# Patient Record
Sex: Female | Born: 1960 | Race: White | Hispanic: No | Smoking: Current every day smoker
Health system: Southern US, Community
[De-identification: ages and names within clinical notes are randomized; demographics above are authoritative.]

## PROBLEM LIST (undated history)

## (undated) DIAGNOSIS — K8020 Calculus of gallbladder without cholecystitis without obstruction: Secondary | ICD-10-CM

## (undated) DIAGNOSIS — G9050 Complex regional pain syndrome I, unspecified: Secondary | ICD-10-CM

## (undated) DIAGNOSIS — E079 Disorder of thyroid, unspecified: Secondary | ICD-10-CM

## (undated) DIAGNOSIS — M810 Age-related osteoporosis without current pathological fracture: Secondary | ICD-10-CM

## (undated) HISTORY — PX: ABDOMINAL HYSTERECTOMY: SHX81

## (undated) HISTORY — DX: Disorder of thyroid, unspecified: E07.9

## (undated) HISTORY — DX: Complex regional pain syndrome I, unspecified: G90.50

## (undated) HISTORY — DX: Age-related osteoporosis without current pathological fracture: M81.0

## (undated) HISTORY — DX: Calculus of gallbladder without cholecystitis without obstruction: K80.20

---

## 2007-09-14 ENCOUNTER — Emergency Department: Payer: Self-pay | Admitting: Emergency Medicine

## 2008-03-05 HISTORY — PX: FRACTURE SURGERY: SHX138

## 2010-06-27 ENCOUNTER — Ambulatory Visit: Payer: Self-pay | Admitting: Oncology

## 2010-06-29 LAB — CEA: CEA: 2.4 ng/mL (ref 0.0–4.7)

## 2010-06-29 LAB — CA 125: CA 125: 14.7 U/mL

## 2010-06-29 LAB — CANCER ANTIGEN 19-9: CA 19-9: 9 U/mL (ref 0–35)

## 2010-06-29 LAB — CANCER ANTIGEN 27.29: CA 27.29: 19.7 U/mL

## 2010-07-04 ENCOUNTER — Ambulatory Visit: Payer: Self-pay | Admitting: Oncology

## 2010-08-04 ENCOUNTER — Ambulatory Visit: Payer: Self-pay | Admitting: Oncology

## 2011-05-21 ENCOUNTER — Emergency Department: Payer: Self-pay | Admitting: Internal Medicine

## 2011-05-21 LAB — COMPREHENSIVE METABOLIC PANEL
Albumin: 3.5 g/dL (ref 3.4–5.0)
Anion Gap: 12 (ref 7–16)
Calcium, Total: 8.7 mg/dL (ref 8.5–10.1)
Glucose: 109 mg/dL — ABNORMAL HIGH (ref 65–99)
Osmolality: 277 (ref 275–301)
Potassium: 3.3 mmol/L — ABNORMAL LOW (ref 3.5–5.1)
Sodium: 140 mmol/L (ref 136–145)

## 2011-05-21 LAB — CBC
Platelet: 256 10*3/uL (ref 150–440)
RDW: 13.8 % (ref 11.5–14.5)
WBC: 7.9 10*3/uL (ref 3.6–11.0)

## 2011-05-21 LAB — TSH: Thyroid Stimulating Horm: 0.58 u[IU]/mL

## 2011-05-21 LAB — TROPONIN I: Troponin-I: <0.02 ng/mL

## 2012-04-03 IMAGING — CR DG CHEST 1V PORT
1 series · 1 of 1 positions shown · non-contrast
Comparison: none

REASON FOR EXAM: sob
COMMENTS:

PROCEDURE:     DXR - DXR PORTABLE CHEST SINGLE VIEW  - May 21, 2011 [DATE]
RESULT:     The lungs are clear. The cardiac silhouette and visualized bony
skeleton are unremarkable.

[portable]
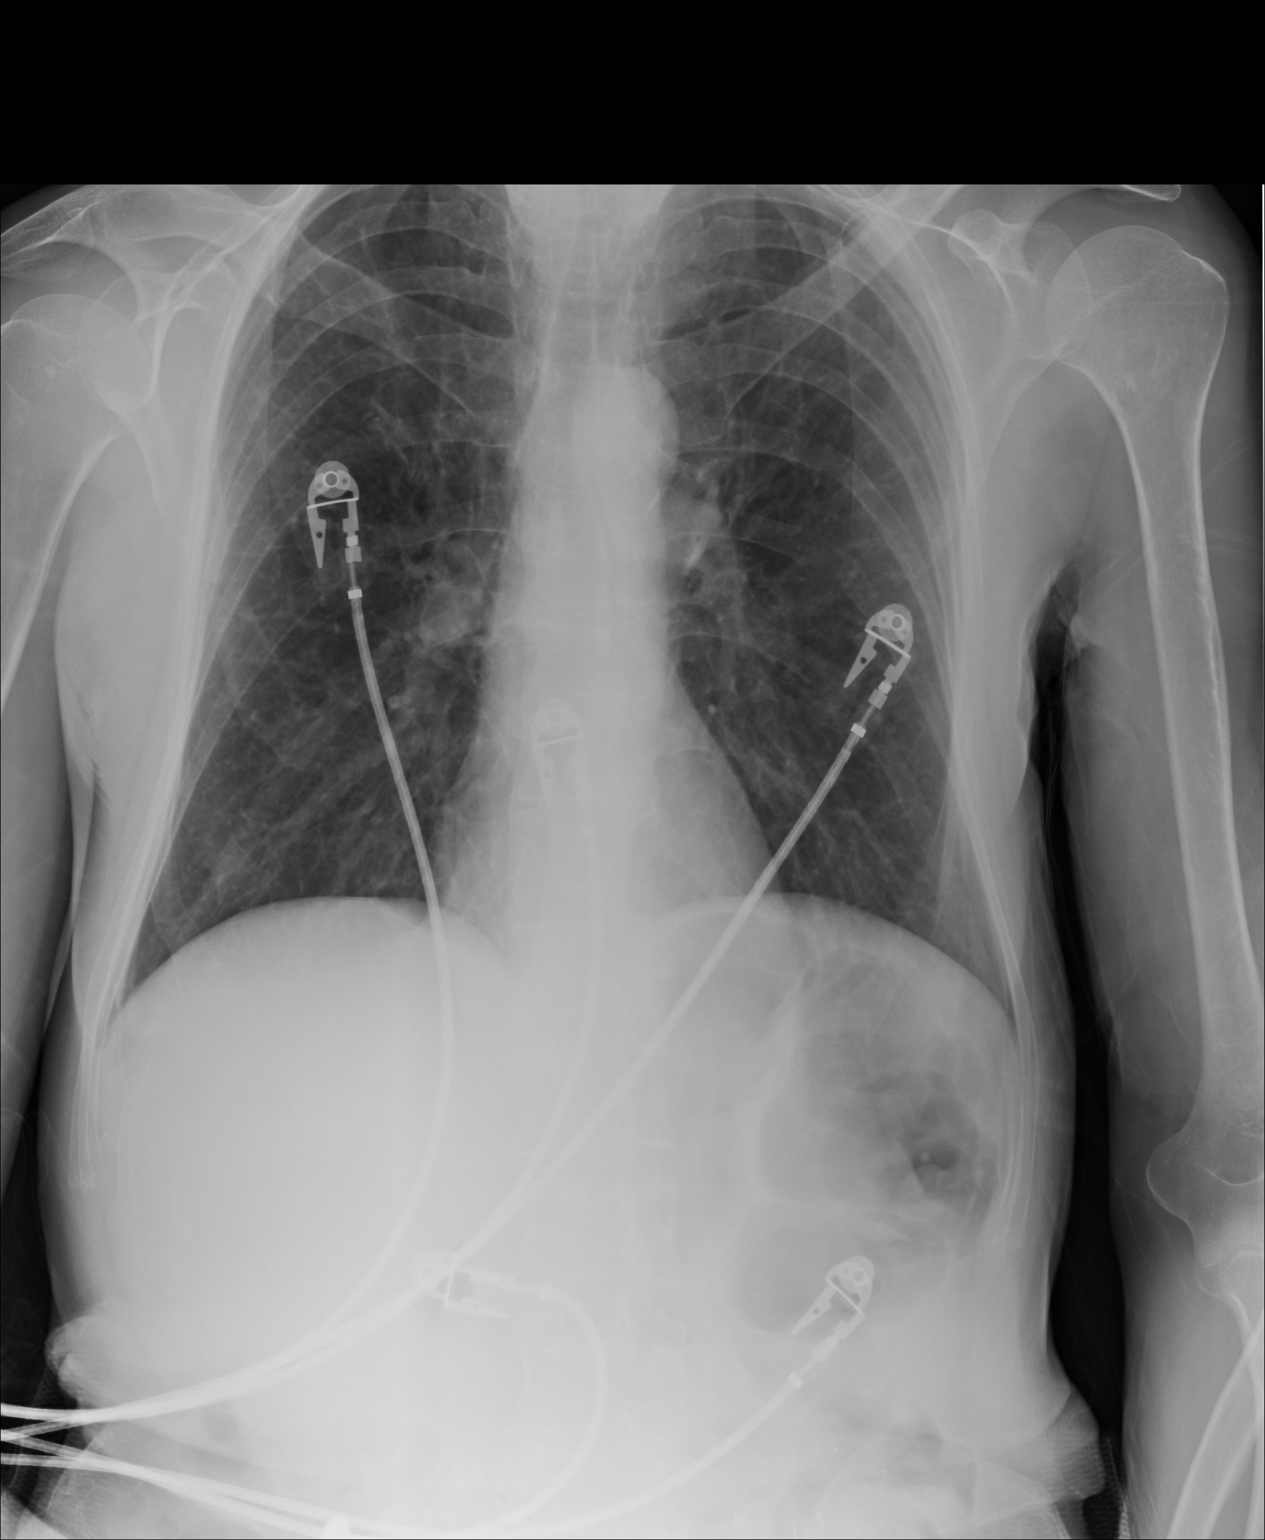

[1 of 1 positions shown; findings below may reference images not displayed]

IMPRESSION: 1. Chest radiograph without evidence of acute cardiopulmonary disease.

## 2017-03-20 ENCOUNTER — Ambulatory Visit: Payer: Medicare Other | Admitting: Nurse Practitioner

## 2017-03-26 ENCOUNTER — Other Ambulatory Visit: Payer: Self-pay

## 2017-03-26 ENCOUNTER — Ambulatory Visit: Payer: Medicare Other | Attending: Nurse Practitioner | Admitting: Nurse Practitioner

## 2017-03-26 ENCOUNTER — Encounter: Payer: Self-pay | Admitting: Nurse Practitioner

## 2017-03-26 VITALS — BP 122/77 | HR 86 | Temp 98.2°F | Resp 16 | Ht 65.0 in | Wt 95.0 lb

## 2017-03-26 DIAGNOSIS — F17210 Nicotine dependence, cigarettes, uncomplicated: Secondary | ICD-10-CM | POA: Insufficient documentation

## 2017-03-26 DIAGNOSIS — Z7409 Other reduced mobility: Secondary | ICD-10-CM | POA: Insufficient documentation

## 2017-03-26 DIAGNOSIS — M25571 Pain in right ankle and joints of right foot: Secondary | ICD-10-CM | POA: Insufficient documentation

## 2017-03-26 DIAGNOSIS — M79604 Pain in right leg: Secondary | ICD-10-CM | POA: Diagnosis not present

## 2017-03-26 DIAGNOSIS — G8929 Other chronic pain: Secondary | ICD-10-CM

## 2017-03-26 DIAGNOSIS — Z90710 Acquired absence of both cervix and uterus: Secondary | ICD-10-CM | POA: Diagnosis not present

## 2017-03-26 DIAGNOSIS — R7982 Elevated C-reactive protein (CRP): Secondary | ICD-10-CM | POA: Diagnosis not present

## 2017-03-26 DIAGNOSIS — Z9889 Other specified postprocedural states: Secondary | ICD-10-CM | POA: Insufficient documentation

## 2017-03-26 DIAGNOSIS — R700 Elevated erythrocyte sedimentation rate: Secondary | ICD-10-CM | POA: Diagnosis not present

## 2017-03-26 DIAGNOSIS — M79605 Pain in left leg: Secondary | ICD-10-CM | POA: Diagnosis not present

## 2017-03-26 DIAGNOSIS — Z789 Other specified health status: Secondary | ICD-10-CM | POA: Diagnosis not present

## 2017-03-26 DIAGNOSIS — M79672 Pain in left foot: Secondary | ICD-10-CM | POA: Insufficient documentation

## 2017-03-26 DIAGNOSIS — G894 Chronic pain syndrome: Secondary | ICD-10-CM

## 2017-03-26 DIAGNOSIS — M899 Disorder of bone, unspecified: Secondary | ICD-10-CM

## 2017-03-26 DIAGNOSIS — Z79899 Other long term (current) drug therapy: Secondary | ICD-10-CM

## 2017-03-26 DIAGNOSIS — M79671 Pain in right foot: Secondary | ICD-10-CM | POA: Insufficient documentation

## 2017-03-26 DIAGNOSIS — Z79891 Long term (current) use of opiate analgesic: Secondary | ICD-10-CM | POA: Diagnosis not present

## 2017-03-26 NOTE — Progress Notes (Signed)
Safety precautions to be maintained throughout the outpatient stay will include: orient to surroundings, keep bed in low position, maintain call bell within reach at all times, provide assistance with transfer out of bed and ambulation.  

## 2017-03-26 NOTE — Patient Instructions (Signed)

## 2017-03-26 NOTE — Progress Notes (Signed)
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 appointment time. This is why it is so important that you arrive at least 30 minutes before that appointment. If you have an appointment scheduled for 10:00 AM and you arrive at 10:01, you will be required to reschedule your appointment.  3. Plan ahead:  Always assume that you will encounter traffic on your way in. Plan for it. If you are dependent on a driver, make sure they understand these rules and the need to arrive early. 4. Other appointments and responsibilities:  Avoid scheduling any other appointments before or after your pain clinic appointments.  5. Be prepared:  Write down everything that you need to discuss with your healthcare provider and give this information to the admitting nurse. Write down the medications that you will need refilled. Bring your pills and bottles (even the empty ones), to all of your appointments, except for those where a procedure is scheduled. 6. No children or pets:  Find someone to take care of them. It is not appropriate to bring  them in. 7. Scheduling changes:  We request "advanced notification" of any changes or cancellations. 8. Advanced notification:  Defined as a time period of more than 24 hours prior to the originally scheduled appointment. This allows for the appointment to be offered to other patients. 9. Rescheduling:  When a visit is rescheduled, it will require the cancellation of the original appointment. For this reason they both fall within the category of "Cancellations".  10. Cancellations:  They require advanced notification. Any cancellation less than 24 hours before the  appointment  will be recorded as a "No Show". 11. No Show:  Defined as an unkept appointment where the patient failed to notify or declare to the practice their intention or inability to keep the appointment.  Corrective process for repeat offenders:  1. Tardiness: Three (3) episodes of rescheduling due to late arrivals will be recorded as one (1) "No Show". 2. Cancellation or reschedule: Three (3) cancellations or rescheduling will be recorded as one (1) "No Show". 3. "No Shows": Three (3) "No Shows" within a 12 month period will result in discharge from the practice.  ____________________________________________________________________________________________   ____________________________________________________________________________________________  Pain Scale  Introduction: The pain score used by this practice is the Verbal Numerical Rating Scale (VNRS-11). This is an 11-point scale. It is for adults and children 10 years or older. There are significant differences in how the pain score is reported, used, and applied. Forget everything you learned in the past and learn this scoring system.  General Information: The scale should reflect your current level of pain. Unless you are specifically asked for the level of your worst pain, or your average pain. If you are asked for one of these two, then it should be understood that  it is over the past 24 hours.  Basic Activities of Daily Living (ADL): Personal hygiene, dressing, eating, transferring, and using restroom.  Instructions: Most patients tend to report their level of pain as a combination of two factors, their physical pain and their psychosocial pain. This last one is also known as "suffering" and it is reflection of how physical pain affects you socially and psychologically. From now on, report them separately. From this point on, when asked to report your pain level, report only your physical pain. Use the following table for reference.  Pain Clinic Pain Levels (0-5/10)  Pain Level Score  Description  No Pain 0   Mild pain 1 Nagging, annoying, but does not interfere with basic activities of daily living (ADL). Patients are able to eat, bathe, get dressed, toileting (being able to get on and off the toilet and perform personal hygiene functions), transfer (move in and out of bed or a chair without assistance), and maintain continence (able to control bladder and bowel functions). Blood pressure and heart rate are unaffected. A normal heart rate for a healthy adult ranges from 60 to 100 bpm (beats per minute).   Mild to moderate pain 2 Noticeable and distracting. Impossible to hide from other people. More frequent flare-ups. Still possible to adapt and function close to normal. It can be very annoying and may have occasional stronger flare-ups. With discipline, patients may get used to it and adapt.   Moderate pain 3 Interferes significantly with activities of daily living (ADL). It becomes difficult to feed, bathe, get dressed, get on and off the toilet or to perform personal hygiene functions. Difficult to get in and out of bed or a chair without assistance. Very distracting. With effort, it can be ignored when deeply involved in activities.   Moderately severe pain 4 Impossible to ignore for more than a few minutes. With effort, patients may still be able to  manage work or participate in some social activities. Very difficult to concentrate. Signs of autonomic nervous system discharge are evident: dilated pupils (mydriasis); mild sweating (diaphoresis); sleep interference. Heart rate becomes elevated (>115 bpm). Diastolic blood pressure (lower number) rises above 100 mmHg. Patients find relief in laying down and not moving.   Severe pain 5 Intense and extremely unpleasant. Associated with frowning  face and frequent crying. Pain overwhelms the senses.  Ability to do any activity or maintain social relationships becomes significantly limited. Conversation becomes difficult. Pacing back and forth is common, as getting into a comfortable position is nearly impossible. Pain wakes you up from deep sleep. Physical signs will be obvious: pupillary dilation; increased sweating; goosebumps; brisk reflexes; cold, clammy hands and feet; nausea, vomiting or dry heaves; loss of appetite; significant sleep disturbance with inability to fall asleep or to remain asleep. When persistent, significant weight loss is observed due to the complete loss of appetite and sleep deprivation.  Blood pressure and heart rate becomes significantly elevated. Caution: If elevated blood pressure triggers a pounding headache associated with blurred vision, then the patient should immediately seek attention at an urgent or emergency care unit, as these may be signs of an impending stroke.    Emergency Department Pain Levels (6-10/10)  Emergency Room Pain 6 Severely limiting. Requires emergency care and should not be seen or managed at an outpatient pain management facility. Communication becomes difficult and requires great effort. Assistance to reach the emergency department may be required. Facial flushing and profuse sweating along with potentially dangerous increases in heart rate and blood pressure will be evident.   Distressing pain 7 Self-care is very difficult. Assistance is required to  transport, or use restroom. Assistance to reach the emergency department will be required. Tasks requiring coordination, such as bathing and getting dressed become very difficult.   Disabling pain 8 Self-care is no longer possible. At this level, pain is disabling. The individual is unable to do even the most "basic" activities such as walking, eating, bathing, dressing, transferring to a bed, or toileting. Fine motor skills are lost. It is difficult to think clearly.   Incapacitating pain 9 Pain becomes incapacitating. Thought processing is no longer possible. Difficult to remember your own name. Control of movement and coordination are lost.   The worst pain imaginable 10 At this level, most patients pass out from pain. When this level is reached, collapse of the autonomic nervous system occurs, leading to a sudden drop in blood pressure and heart rate. This in turn results in a temporary and dramatic drop in blood flow to the brain, leading to a loss of consciousness. Fainting is one of the body's self defense mechanisms. Passing out puts the brain in a calmed state and causes it to shut down for a while, in order to begin the healing process.    Summary: 1. Refer to this scale when providing [place] with your pain level. 2. Be accurate and careful when reporting your pain level. This will help with your care. 3. Over-reporting your pain level will lead to loss of credibility. 4. Even a level of 1/10 means that there is pain and will be treated at our facility. 5. High, inaccurate reporting will be documented as "Symptom Exaggeration", leading to loss of credibility and suspicions of possible secondary gains such as obtaining more narcotics, or wanting to appear disabled, for fraudulent reasons. 6. Only pain levels of 5 or below will be seen at our facility. 7. Pain levels of 6 and above will be sent to the Emergency Department and the appointment  cancelled. ____________________________________________________________________________________________

## 2017-03-28 ENCOUNTER — Other Ambulatory Visit: Payer: Self-pay | Admitting: Nurse Practitioner

## 2017-03-28 DIAGNOSIS — R7982 Elevated C-reactive protein (CRP): Secondary | ICD-10-CM | POA: Insufficient documentation

## 2017-03-28 DIAGNOSIS — R700 Elevated erythrocyte sedimentation rate: Secondary | ICD-10-CM

## 2017-04-02 ENCOUNTER — Telehealth: Payer: Self-pay | Admitting: Nurse Practitioner

## 2017-04-02 NOTE — Telephone Encounter (Signed)
Discard

## 2017-04-05 LAB — COMP. METABOLIC PANEL (12)
ALBUMIN: 4.4 g/dL (ref 3.5–5.5)
ALK PHOS: 131 IU/L — AB (ref 39–117)
AST: 13 IU/L (ref 0–40)
Albumin/Globulin Ratio: 1.8 (ref 1.2–2.2)
BILIRUBIN TOTAL: 0.3 mg/dL (ref 0.0–1.2)
BUN/Creatinine Ratio: 6 — ABNORMAL LOW (ref 9–23)
BUN: 3 mg/dL — ABNORMAL LOW (ref 6–24)
Calcium: 9.3 mg/dL (ref 8.7–10.2)
Chloride: 100 mmol/L (ref 96–106)
Creatinine, Ser: 0.48 mg/dL — ABNORMAL LOW (ref 0.57–1.00)
GFR calc Af Amer: 129 mL/min/{1.73_m2} (ref >59)
GFR calc non Af Amer: 112 mL/min/{1.73_m2} (ref >59)
GLOBULIN, TOTAL: 2.5 g/dL (ref 1.5–4.5)
Glucose: 106 mg/dL — ABNORMAL HIGH (ref 65–99)
POTASSIUM: 3.5 mmol/L (ref 3.5–5.2)
SODIUM: 141 mmol/L (ref 134–144)
Total Protein: 6.9 g/dL (ref 6.0–8.5)

## 2017-04-05 LAB — DRUG SCREEN 10 W/CONF, SERUM
Amphetamines, IA: NEGATIVE ng/mL
BARBITURATES, IA: NEGATIVE ug/mL
Benzodiazepines, IA: NEGATIVE ng/mL
Cocaine & Metabolite, IA: NEGATIVE ng/mL
METHADONE, IA: NEGATIVE ng/mL
Opiates, IA: NEGATIVE ng/mL
Oxycodones, IA: POSITIVE ng/mL — AB
PHENCYCLIDINE, IA: NEGATIVE ng/mL
Propoxyphene, IA: NEGATIVE ng/mL
THC(MARIJUANA) METABOLITE, IA: NEGATIVE ng/mL

## 2017-04-05 LAB — SEDIMENTATION RATE: Sed Rate: 45 mm/hr — ABNORMAL HIGH (ref 0–40)

## 2017-04-05 LAB — OXYCODONES,MS,WB/SP RFX
OXYCODONES CONFIRMATION: POSITIVE
Oxycocone: 38.5 ng/mL
Oxymorphone: NEGATIVE ng/mL

## 2017-04-05 LAB — MAGNESIUM: MAGNESIUM: 2.1 mg/dL (ref 1.6–2.3)

## 2017-04-05 LAB — VITAMIN B12: VITAMIN B 12: 266 pg/mL (ref 232–1245)

## 2017-04-05 LAB — 25-HYDROXY VITAMIN D LCMS D2+D3
25-Hydroxy, Vitamin D-2: <1 ng/mL
25-Hydroxy, Vitamin D: 14 ng/mL — ABNORMAL LOW

## 2017-04-05 LAB — 25-HYDROXYVITAMIN D LCMS D2+D3: 25-HYDROXY, VITAMIN D-3: 14 ng/mL

## 2017-04-05 LAB — C-REACTIVE PROTEIN: CRP: 13.1 mg/L — ABNORMAL HIGH (ref 0.0–4.9)

## 2017-04-19 LAB — RHEUMATOID FACTOR

## 2017-04-19 LAB — SPECIMEN STATUS REPORT

## 2017-04-19 LAB — ANA W/REFLEX IF POSITIVE: ANA: NEGATIVE

## 2017-05-22 ENCOUNTER — Ambulatory Visit: Payer: Medicare Other | Admitting: Pain Medicine

## 2017-06-03 ENCOUNTER — Ambulatory Visit: Payer: Medicare Other | Admitting: Pain Medicine

## 2017-06-10 ENCOUNTER — Encounter: Payer: Self-pay | Admitting: Pain Medicine

## 2017-06-10 ENCOUNTER — Other Ambulatory Visit: Payer: Self-pay

## 2017-06-10 ENCOUNTER — Ambulatory Visit: Payer: Medicare Other | Attending: Pain Medicine | Admitting: Pain Medicine

## 2017-06-10 VITALS — BP 111/80 | HR 94 | Temp 98.4°F | Ht 65.0 in | Wt 95.0 lb

## 2017-06-10 DIAGNOSIS — G5772 Causalgia of left lower limb: Secondary | ICD-10-CM

## 2017-06-10 DIAGNOSIS — M899 Disorder of bone, unspecified: Secondary | ICD-10-CM | POA: Diagnosis not present

## 2017-06-10 DIAGNOSIS — Z79899 Other long term (current) drug therapy: Secondary | ICD-10-CM | POA: Diagnosis not present

## 2017-06-10 DIAGNOSIS — G894 Chronic pain syndrome: Secondary | ICD-10-CM

## 2017-06-10 DIAGNOSIS — G9050 Complex regional pain syndrome I, unspecified: Secondary | ICD-10-CM | POA: Insufficient documentation

## 2017-06-10 DIAGNOSIS — G8929 Other chronic pain: Secondary | ICD-10-CM

## 2017-06-10 DIAGNOSIS — Z79891 Long term (current) use of opiate analgesic: Secondary | ICD-10-CM

## 2017-06-10 DIAGNOSIS — G5773 Causalgia of bilateral lower limbs: Secondary | ICD-10-CM

## 2017-06-10 DIAGNOSIS — F17210 Nicotine dependence, cigarettes, uncomplicated: Secondary | ICD-10-CM | POA: Insufficient documentation

## 2017-06-10 DIAGNOSIS — R7982 Elevated C-reactive protein (CRP): Secondary | ICD-10-CM | POA: Diagnosis not present

## 2017-06-10 DIAGNOSIS — M79604 Pain in right leg: Secondary | ICD-10-CM | POA: Diagnosis not present

## 2017-06-10 DIAGNOSIS — R102 Pelvic and perineal pain unspecified side: Secondary | ICD-10-CM

## 2017-06-10 DIAGNOSIS — G5771 Causalgia of right lower limb: Secondary | ICD-10-CM

## 2017-06-10 DIAGNOSIS — M79675 Pain in left toe(s): Secondary | ICD-10-CM

## 2017-06-10 DIAGNOSIS — E079 Disorder of thyroid, unspecified: Secondary | ICD-10-CM | POA: Insufficient documentation

## 2017-06-10 DIAGNOSIS — E559 Vitamin D deficiency, unspecified: Secondary | ICD-10-CM | POA: Diagnosis not present

## 2017-06-10 DIAGNOSIS — M25571 Pain in right ankle and joints of right foot: Secondary | ICD-10-CM

## 2017-06-10 DIAGNOSIS — M25579 Pain in unspecified ankle and joints of unspecified foot: Secondary | ICD-10-CM | POA: Diagnosis present

## 2017-06-10 DIAGNOSIS — R700 Elevated erythrocyte sedimentation rate: Secondary | ICD-10-CM

## 2017-06-10 DIAGNOSIS — M25572 Pain in left ankle and joints of left foot: Secondary | ICD-10-CM | POA: Diagnosis not present

## 2017-06-10 DIAGNOSIS — M79674 Pain in right toe(s): Secondary | ICD-10-CM | POA: Diagnosis not present

## 2017-06-10 DIAGNOSIS — M792 Neuralgia and neuritis, unspecified: Secondary | ICD-10-CM

## 2017-06-10 DIAGNOSIS — M810 Age-related osteoporosis without current pathological fracture: Secondary | ICD-10-CM | POA: Diagnosis not present

## 2017-06-10 DIAGNOSIS — Z789 Other specified health status: Secondary | ICD-10-CM

## 2017-06-10 DIAGNOSIS — M79605 Pain in left leg: Secondary | ICD-10-CM

## 2017-06-10 MED ORDER — VITAMIN D3 125 MCG (5000 UT) PO CAPS: 1.0000 | ORAL_CAPSULE | Freq: Every day | ORAL | 5 refills | Status: AC

## 2017-06-10 MED ORDER — MAGNESIUM 500 MG PO CAPS: 500.0000 mg | ORAL_CAPSULE | Freq: Two times a day (BID) | ORAL | 5 refills | Status: AC

## 2017-06-10 MED ORDER — ERGOCALCIFEROL 1.25 MG (50000 UT) PO CAPS: 50000.0000 [IU] | ORAL_CAPSULE | ORAL | 0 refills | Status: AC

## 2017-06-10 MED ORDER — GNP CALCIUM 1200 1200-1000 MG-UNIT PO CHEW: 1200.0000 mg | CHEWABLE_TABLET | Freq: Every day | ORAL | 5 refills | Status: AC

## 2017-06-10 NOTE — Patient Instructions (Addendum)
____________________________________________________________________________________________  Pain Scale  Introduction: The pain score used by this practice is the Verbal Numerical Rating Scale (VNRS-11). This is an 11-point scale. It is for adults and children 10 years or older. There are significant differences in how the pain score is reported, used, and applied. Forget everything you learned in the past and learn this scoring system.  General Information: The scale should reflect your current level of pain. Unless you are specifically asked for the level of your worst pain, or your average pain. If you are asked for one of these two, then it should be understood that it is over the past 24 hours.  Basic Activities of Daily Living (ADL): Personal hygiene, dressing, eating, transferring, and using restroom.  Instructions: Most patients tend to report their level of pain as a combination of two factors, their physical pain and their psychosocial pain. This last one is also known as "suffering" and it is reflection of how physical pain affects you socially and psychologically. From now on, report them separately. From this point on, when asked to report your pain level, report only your physical pain. Use the following table for reference.  Pain Clinic Pain Levels (0-5/10)  Pain Level Score  Description  No Pain 0   Mild pain 1 Nagging, annoying, but does not interfere with basic activities of daily living (ADL). Patients are able to eat, bathe, get dressed, toileting (being able to get on and off the toilet and perform personal hygiene functions), transfer (move in and out of bed or a chair without assistance), and maintain continence (able to control bladder and bowel functions). Blood pressure and heart rate are unaffected. A normal heart rate for a healthy adult ranges from 60 to 100 bpm (beats per minute).   Mild to moderate pain 2 Noticeable and distracting. Impossible to hide from other  people. More frequent flare-ups. Still possible to adapt and function close to normal. It can be very annoying and may have occasional stronger flare-ups. With discipline, patients may get used to it and adapt.   Moderate pain 3 Interferes significantly with activities of daily living (ADL). It becomes difficult to feed, bathe, get dressed, get on and off the toilet or to perform personal hygiene functions. Difficult to get in and out of bed or a chair without assistance. Very distracting. With effort, it can be ignored when deeply involved in activities.   Moderately severe pain 4 Impossible to ignore for more than a few minutes. With effort, patients may still be able to manage work or participate in some social activities. Very difficult to concentrate. Signs of autonomic nervous system discharge are evident: dilated pupils (mydriasis); mild sweating (diaphoresis); sleep interference. Heart rate becomes elevated (>115 bpm). Diastolic blood pressure (lower number) rises above 100 mmHg. Patients find relief in laying down and not moving.   Severe pain 5 Intense and extremely unpleasant. Associated with frowning face and frequent crying. Pain overwhelms the senses.  Ability to do any activity or maintain social relationships becomes significantly limited. Conversation becomes difficult. Pacing back and forth is common, as getting into a comfortable position is nearly impossible. Pain wakes you up from deep sleep. Physical signs will be obvious: pupillary dilation; increased sweating; goosebumps; brisk reflexes; cold, clammy hands and feet; nausea, vomiting or dry heaves; loss of appetite; significant sleep disturbance with inability to fall asleep or to remain asleep. When persistent, significant weight loss is observed due to the complete loss of appetite and sleep deprivation.  Blood   pressure and heart rate becomes significantly elevated. Caution: If elevated blood pressure triggers a pounding headache  associated with blurred vision, then the patient should immediately seek attention at an urgent or emergency care unit, as these may be signs of an impending stroke.    Emergency Department Pain Levels (6-10/10)  Emergency Room Pain 6 Severely limiting. Requires emergency care and should not be seen or managed at an outpatient pain management facility. Communication becomes difficult and requires great effort. Assistance to reach the emergency department may be required. Facial flushing and profuse sweating along with potentially dangerous increases in heart rate and blood pressure will be evident.   Distressing pain 7 Self-care is very difficult. Assistance is required to transport, or use restroom. Assistance to reach the emergency department will be required. Tasks requiring coordination, such as bathing and getting dressed become very difficult.   Disabling pain 8 Self-care is no longer possible. At this level, pain is disabling. The individual is unable to do even the most "basic" activities such as walking, eating, bathing, dressing, transferring to a bed, or toileting. Fine motor skills are lost. It is difficult to think clearly.   Incapacitating pain 9 Pain becomes incapacitating. Thought processing is no longer possible. Difficult to remember your own name. Control of movement and coordination are lost.   The worst pain imaginable 10 At this level, most patients pass out from pain. When this level is reached, collapse of the autonomic nervous system occurs, leading to a sudden drop in blood pressure and heart rate. This in turn results in a temporary and dramatic drop in blood flow to the brain, leading to a loss of consciousness. Fainting is one of the body's self defense mechanisms. Passing out puts the brain in a calmed state and causes it to shut down for a while, in order to begin the healing process.    Summary: 1. Refer to this scale when providing us with your pain level. 2. Be  accurate and careful when reporting your pain level. This will help with your care. 3. Over-reporting your pain level will lead to loss of credibility. 4. Even a level of 1/10 means that there is pain and will be treated at our facility. 5. High, inaccurate reporting will be documented as "Symptom Exaggeration", leading to loss of credibility and suspicions of possible secondary gains such as obtaining more narcotics, or wanting to appear disabled, for fraudulent reasons. 6. Only pain levels of 5 or below will be seen at our facility. 7. Pain levels of 6 and above will be sent to the Emergency Department and the appointment cancelled. ____________________________________________________________________________________________   ____________________________________________________________________________________________  Preparing for Procedure with Sedation  Instructions: . Oral Intake: Do not eat or drink anything for at least 8 hours prior to your procedure. . Transportation: Public transportation is not allowed. Bring an adult driver. The driver must be physically present in our waiting room before any procedure can be started. . Physical Assistance: Bring an adult physically capable of assisting you, in the event you need help. This adult should keep you company at home for at least 6 hours after the procedure. . Blood Pressure Medicine: Take your blood pressure medicine with a sip of water the morning of the procedure. . Blood thinners:  . Diabetics on insulin: Notify the staff so that you can be scheduled 1st case in the morning. If your diabetes requires high dose insulin, take only  of your normal insulin dose the morning of the procedure and   notify the staff that you have done so. . Preventing infections: Shower with an antibacterial soap the morning of your procedure. . Build-up your immune system: Take 1000 mg of Vitamin C with every meal (3 times a day) the day prior to your  procedure. Marland Kitchen Antibiotics: Inform the staff if you have a condition or reason that requires you to take antibiotics before dental procedures. . Pregnancy: If you are pregnant, call and cancel the procedure. . Sickness: If you have a cold, fever, or any active infections, call and cancel the procedure. . Arrival: You must be in the facility at least 30 minutes prior to your scheduled procedure. . Children: Do not bring children with you. . Dress appropriately: Bring dark clothing that you would not mind if they get stained. . Valuables: Do not bring any jewelry or valuables.  Procedure appointments are reserved for interventional treatments only. Marland Kitchen No Prescription Refills. . No medication changes will be discussed during procedure appointments. . No disability issues will be discussed.  Remember:  Regular Business hours are:  Monday to Thursday 8:00 AM to 4:00 PM  Provider's Schedule: Delano Metz, MD:  Procedure days: Tuesday and Thursday 7:30 AM to 4:00 PM  Edward Jolly, MD:  Procedure days: Monday and Wednesday 7:30 AM to 4:00 PM ____________________________________________________________________________________________   Epidural Steroid Injection Patient Information  Description: The epidural space surrounds the nerves as they exit the spinal cord.  In some patients, the nerves can be compressed and inflamed by a bulging disc or a tight spinal canal (spinal stenosis).  By injecting steroids into the epidural space, we can bring irritated nerves into direct contact with a potentially helpful medication.  These steroids act directly on the irritated nerves and can reduce swelling and inflammation which often leads to decreased pain.  Epidural steroids may be injected anywhere along the spine and from the neck to the low back depending upon the location of your pain.   After numbing the skin with local anesthetic (like Novocaine), a small needle is passed into the epidural space  slowly.  You may experience a sensation of pressure while this is being done.  The entire block usually last less than 10 minutes.  Conditions which may be treated by epidural steroids:   Low back and leg pain  Neck and arm pain  Spinal stenosis  Post-laminectomy syndrome  Herpes zoster (shingles) pain  Pain from compression fractures  Preparation for the injection:  1. Do not eat any solid food or dairy products within 8 hours of your appointment.  2. You may drink clear liquids up to 3 hours before appointment.  Clear liquids include water, black coffee, juice or soda.  No milk or cream please. 3. You may take your regular medication, including pain medications, with a sip of water before your appointment  Diabetics should hold regular insulin (if taken separately) and take 1/2 normal NPH dos the morning of the procedure.  Carry some sugar containing items with you to your appointment. 4. A driver must accompany you and be prepared to drive you home after your procedure.  5. Bring all your current medications with your. 6. An IV may be inserted and sedation may be given at the discretion of the physician.   7. A blood pressure cuff, EKG and other monitors will often be applied during the procedure.  Some patients may need to have extra oxygen administered for a short period. 8. You will be asked to provide medical information, including your  allergies, prior to the procedure.  We must know immediately if you are taking blood thinners (like Coumadin/Warfarin)  Or if you are allergic to IV iodine contrast (dye). We must know if you could possible be pregnant.  Possible side-effects:  Bleeding from needle site  Infection (rare, may require surgery)  Nerve injury (rare)  Numbness & tingling (temporary)  Difficulty urinating (rare, temporary)  Spinal headache ( a headache worse with upright posture)  Light -headedness (temporary)  Pain at injection site (several  days)  Decreased blood pressure (temporary)  Weakness in arm/leg (temporary)  Pressure sensation in back/neck (temporary)  Call if you experience:  Fever/chills associated with headache or increased back/neck pain.  Headache worsened by an upright position.  New onset weakness or numbness of an extremity below the injection site  Hives or difficulty breathing (go to the emergency room)  Inflammation or drainage at the infection site  Severe back/neck pain  Any new symptoms which are concerning to you  Please note:  Although the local anesthetic injected can often make your back or neck feel good for several hours after the injection, the pain will likely return.  It takes 3-7 days for steroids to work in the epidural space.  You may not notice any pain relief for at least that one week.  If effective, we will often do a series of three injections spaced 3-6 weeks apart to maximally decrease your pain.  After the initial series, we generally will wait several months before considering a repeat injection of the same type.  If you have any questions, please call [phone] [facility] Clinic[facility] R[facility] AND COMPLICATIONS  What are the risk, side effects and possible complications? Generally speaking, most procedures are safe.  However, with any procedure there are risks, side effects, and the possibility of complications.  The risks and complications are dependent upon the sites that are lesioned, or the type of nerve block to be performed.  The closer the procedure is to the spine, the more serious the risks are.  Great care is taken when placing the radio frequency needles, block needles or lesioning probes, but sometimes complications can occur. 1. Infection: Any time there is an injection through the skin, there is a risk of infection.  This is why sterile conditions are used for these blocks.  There are four possible types of  infection. 1. Localized skin infection. 2. Central Nervous System Infection-This can be in the form of Meningitis, which can be deadly. 3. Epidural Infections-This can be in the form of an epidural abscess, which can cause pressure inside of the spine, causing compression of the spinal cord with subsequent paralysis. This would require an emergency surgery to decompress, and there are no guarantees that the patient would recover from the paralysis. 4. Discitis-This is an infection of the intervertebral discs.  It occurs in about 1% of discography procedures.  It is difficult to treat and it may lead to surgery.        2. Pain: the needles have to go through skin and soft tissues, will cause soreness.       3. Damage to internal structures:  The nerves to be lesioned may be near blood vessels or    other nerves which can be potentially damaged.       4. Bleeding: Bleeding is more common if the patient is taking blood thinners such as  aspirin, Coumadin, Ticiid, Plavix, etc., or if he/she have some genetic predisposition  such as hemophilia. Bleeding into the spinal canal can cause compression of the spinal  cord with subsequent paralysis.  This would require an emergency surgery to  decompress and there are no guarantees that the patient would recover from the  paralysis.       5. Pneumothorax:  Puncturing of a lung is a possibility, every time a needle is introduced in  the area of the chest or upper back.  Pneumothorax refers to free air around the  collapsed lung(s), inside of the thoracic cavity (chest cavity).  Another two possible  complications related to a similar event would include: Hemothorax and Chylothorax.   These are variations of the Pneumothorax, where instead of air around the collapsed  lung(s), you may have blood or chyle, respectively.       6. Spinal headaches: They may occur with any procedures in the area of the spine.       7. Persistent CSF (Cerebro-Spinal Fluid) leakage: This is  a rare problem, but may occur  with prolonged intrathecal or epidural catheters either due to the formation of a fistulous  track or a dural tear.       8. Nerve damage: By working so close to the spinal cord, there is always a possibility of  nerve damage, which could be as serious as a permanent spinal cord injury with  paralysis.       9. Death:  Although rare, severe deadly allergic reactions known as "Anaphylactic  reaction" can occur to any of the medications used.      10. Worsening of the symptoms:  We can always make thing worse.  What are the chances of something like this happening? Chances of any of this occuring are extremely low.  By statistics, you have more of a chance of getting killed in a motor vehicle accident: while driving to the hospital than any of the above occurring .  Nevertheless, you should be aware that they are possibilities.  In general, it is similar to taking a shower.  Everybody knows that you can slip, hit your head and get killed.  Does that mean that you should not shower again?  Nevertheless always keep in mind that statistics do not mean anything if you happen to be on the wrong side of them.  Even if a procedure has a 1 (one) in a 1,000,000 (million) chance of going wrong, it you happen to be that one..Also, keep in mind that by statistics, you have more of a chance of having something go wrong when taking medications.  Who should not have this procedure? If you are on a blood thinning medication (e.g. Coumadin, Plavix, see list of "Blood Thinners"), or if you have an active infection going on, you should not have the procedure.  If you are taking any blood thinners, please inform your physician.  How should I prepare for this procedure?  Do not eat or drink anything at least six hours prior to the procedure.  Bring a driver with you .  It cannot be a taxi.  Come accompanied by an adult that can drive you back, and that is strong enough to help you if your  legs get weak or numb from the local anesthetic.  Take all of your medicines the morning of the procedure with just enough water to swallow them.  If you have diabetes, make sure that you are scheduled to have your procedure done first thing in the morning, whenever possible.  If you  have diabetes, take only half of your insulin dose and notify our nurse that you have done so as soon as you arrive at the clinic.  If you are diabetic, but only take blood sugar pills (oral hypoglycemic), then do not take them on the morning of your procedure.  You may take them after you have had the procedure.  Do not take aspirin or any aspirin-containing medications, at least eleven (11) days prior to the procedure.  They may prolong bleeding.  Wear loose fitting clothing that may be easy to take off and that you would not mind if it got stained with Betadine or blood.  Do not wear any jewelry or perfume  Remove any nail coloring.  It will interfere with some of our monitoring equipment.  NOTE: Remember that this is not meant to be interpreted as a complete list of all possible complications.  Unforeseen problems may occur.  BLOOD THINNERS The following drugs contain aspirin or other products, which can cause increased bleeding during surgery and should not be taken for 2 weeks prior to and 1 week after surgery.  If you should need take something for relief of minor pain, you may take acetaminophen which is found in Tylenol,m Datril, Anacin-3 and Panadol. It is not blood thinner. The products listed below are.  Do not take any of the products listed below in addition to any listed on your instruction sheet.  A.P.C or A.P.C with Codeine Codeine Phosphate Capsules #3 Ibuprofen Ridaura  ABC compound Congesprin Imuran rimadil  Advil Cope Indocin Robaxisal  Alka-Seltzer Effervescent Pain Reliever and Antacid Coricidin or Coricidin-D  Indomethacin Rufen  Alka-Seltzer plus Cold Medicine Cosprin Ketoprofen S-A-C  Tablets  Anacin Analgesic Tablets or Capsules Coumadin Korlgesic Salflex  Anacin Extra Strength Analgesic tablets or capsules CP-2 Tablets Lanoril Salicylate  Anaprox Cuprimine Capsules Levenox Salocol  Anexsia-D Dalteparin Magan Salsalate  Anodynos Darvon compound Magnesium Salicylate Sine-off  Ansaid Dasin Capsules Magsal Sodium Salicylate  Anturane Depen Capsules Marnal Soma  APF Arthritis pain formula Dewitt's Pills Measurin Stanback  Argesic Dia-Gesic Meclofenamic Sulfinpyrazone  Arthritis Bayer Timed Release Aspirin Diclofenac Meclomen Sulindac  Arthritis pain formula Anacin Dicumarol Medipren Supac  Analgesic (Safety coated) Arthralgen Diffunasal Mefanamic Suprofen  Arthritis Strength Bufferin Dihydrocodeine Mepro Compound Suprol  Arthropan liquid Dopirydamole Methcarbomol with Aspirin Synalgos  ASA tablets/Enseals Disalcid Micrainin Tagament  Ascriptin Doan's Midol Talwin  Ascriptin A/D Dolene Mobidin Tanderil  Ascriptin Extra Strength Dolobid Moblgesic Ticlid  Ascriptin with Codeine Doloprin or Doloprin with Codeine Momentum Tolectin  Asperbuf Duoprin Mono-gesic Trendar  Aspergum Duradyne Motrin or Motrin IB Triminicin  Aspirin plain, buffered or enteric coated Durasal Myochrisine Trigesic  Aspirin Suppositories Easprin Nalfon Trillsate  Aspirin with Codeine Ecotrin Regular or Extra Strength Naprosyn Uracel  Atromid-S Efficin Naproxen Ursinus  Auranofin Capsules Elmiron Neocylate Vanquish  Axotal Emagrin Norgesic Verin  Azathioprine Empirin or Empirin with Codeine Normiflo Vitamin E  Azolid Emprazil Nuprin Voltaren  Bayer Aspirin plain, buffered or children's or timed BC Tablets or powders Encaprin Orgaran Warfarin Sodium  Buff-a-Comp Enoxaparin Orudis Zorpin  Buff-a-Comp with Codeine Equegesic Os-Cal-Gesic   Buffaprin Excedrin plain, buffered or Extra Strength Oxalid   Bufferin Arthritis Strength Feldene Oxphenbutazone   Bufferin plain or Extra Strength Feldene Capsules  Oxycodone with Aspirin   Bufferin with Codeine Fenoprofen Fenoprofen Pabalate or Pabalate-SF   Buffets II Flogesic Panagesic   Buffinol plain or Extra Strength Florinal or Florinal with Codeine Panwarfarin   Buf-Tabs Flurbiprofen Penicillamine   Butalbital Compound Four-way  cold tablets Penicillin   Butazolidin Fragmin Pepto-Bismol   Carbenicillin Geminisyn Percodan   Carna Arthritis Reliever Geopen Persantine   Carprofen Gold's salt Persistin   Chloramphenicol Goody's Phenylbutazone   Chloromycetin Haltrain Piroxlcam   Clmetidine heparin Plaquenil   Cllnoril Hyco-pap Ponstel   Clofibrate Hydroxy chloroquine Propoxyphen         Before stopping any of these medications, be sure to consult the physician who ordered them.  Some, such as Coumadin (Warfarin) are ordered to prevent or treat serious conditions such as "deep thrombosis", "pumonary embolisms", and other heart problems.  The amount of time that you may need off of the medication may also vary with the medication and the reason for which you were taking it.  If you are taking any of these medications, please make sure you notify your pain physician before you undergo any procedures.         Lumbar Sympathetic Block Patient Information  Description: The lumbar plexus is a group of nerves that are part of the sympathetic nervous system.  These nerves supply organs in the pelvis and legs.  Lumbar sympathetic blocks are utilized for the diagnosis and treatment of painful conditions in these areas.   The lumbar plexus is located on both sides of the aorta at approximately the level of the second lumbar vertebral body.  The block will be performed with you lying on your abdomen with a pillow underneath.  Using direct x-ray guidance,   The plexus will be located on both sides of the spine.  Numbing medicine will be used to deaden the skin prior to needle insertion.  In most cases, a small amount of sedation can be give by IV prior to the  numbing medicine.  One or two small needles will be placed near the plexus and local anesthetic will be injected.  This may make your leg(s) feel warm.  The Entire block usually lasts about 15-25 minutes.  Conditions which may be treated by lumbar sympathetic block:   Reflex sympathetic dystrophy  Phantom limb pain  Peripheral neuropathy  Peripheral vascular disease ( inadequate blood flow )  Cancer pain of pelvis, leg and kidney  Preparation for the injection:  12. Do note eat any solid food or diary products within 8 hours of your appointment. 13. You may drink clear liquids up to 3 hours before appointment.  Clear liquids include water, black coffee, juice or soda.  No milk or cream please. 14. You may take your regular medication, including pain medications, with a sip of water before you appointment.  Diabetics should hold regular insulin ( if taken separately ) and take 1/2 NPH dose the morning of the procedure .  Carry some sugar containing items with you to your appointment. 15. A driver must accompany you and be prepared to drive you home after your procedure. 16. Bring all your current medication with you. 17. An IV may be inserted and sedation may be given at the discretion of the physician.  18. A blood pressure cuff, EKG and other monitors will often be applied during the procedure.  Some patients may need to have extra oxygen administered for a short period. 19. You will be asked to provide medical information, including your allergies and medications, prior to the procedure.  We must know immediately if your taking blood thinners (like Coumadin/Warfarin) or if you are allergic to IV iodine contrast (dye).  We must know if you could possibly be pregnant.  Possible side-effects  Bleeding from needle site or deeper  Infection (rare, can require surgery)  Nerve injury (rare)  Numbness & tingling (temporary)  Collapsed lung (rare)  Spinal headache (a headache worse  with upright posture)  Light-headedness (temporary)  Pain at injection site (several days)  Decreased blood pressure (temporary)  Weakness in legs (temporary)  Seizure or other drug reaction (rare)  Call if you experience:   Fever/chills associated with headache or increased back/ neck pain  Headache worsened by an upright position  New onset weakness or numbness of an extremity below the injection site  Hives or difficulty breathing ( go to the emergency room)  Inflammation or drainage at the injections site(s)  New symptoms which are concerning to you  Please note:  If effective, we will often do a series of 2-3 injections spaced 3-6 weeks apart to maximally decrease your pain.  If initial series is effective, you may be a candidate for a more permanent block of the lumbar sympathetic plexus.  If you have any questions please call [phone] [facility]
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
## 2017-08-07 ENCOUNTER — Other Ambulatory Visit: Payer: Self-pay

## 2019-04-06 DEATH — deceased
